# Patient Record
Sex: Female | Born: 1975 | Race: White | Hispanic: No | Marital: Married
Health system: Southern US, Community
[De-identification: ages and names within clinical notes are randomized; demographics above are authoritative.]

---

## 2001-10-22 ENCOUNTER — Ambulatory Visit (HOSPITAL_COMMUNITY): Admission: RE | Admit: 2001-10-22 | Discharge: 2001-10-22 | Payer: Self-pay | Admitting: *Deleted

## 2001-10-22 ENCOUNTER — Encounter: Payer: Self-pay | Admitting: *Deleted

## 2002-03-11 ENCOUNTER — Ambulatory Visit (HOSPITAL_COMMUNITY): Admission: AD | Admit: 2002-03-11 | Discharge: 2002-03-11 | Payer: Self-pay | Admitting: *Deleted

## 2002-03-14 ENCOUNTER — Inpatient Hospital Stay (HOSPITAL_COMMUNITY): Admission: AD | Admit: 2002-03-14 | Discharge: 2002-03-17 | Payer: Self-pay | Admitting: *Deleted

## 2013-07-15 ENCOUNTER — Ambulatory Visit: Payer: Self-pay | Admitting: Family Medicine

## 2013-12-07 ENCOUNTER — Other Ambulatory Visit (HOSPITAL_COMMUNITY): Payer: Self-pay | Admitting: Family Medicine

## 2013-12-07 ENCOUNTER — Ambulatory Visit (HOSPITAL_COMMUNITY)
Admission: RE | Admit: 2013-12-07 | Discharge: 2013-12-07 | Disposition: A | Discharge status: Home / Self Care | Payer: BC Managed Care – PPO | Source: Ambulatory Visit | Attending: Family Medicine | Admitting: Family Medicine

## 2013-12-07 DIAGNOSIS — M25559 Pain in unspecified hip: Secondary | ICD-10-CM | POA: Insufficient documentation

## 2013-12-07 DIAGNOSIS — M25552 Pain in left hip: Secondary | ICD-10-CM

## 2016-05-09 ENCOUNTER — Other Ambulatory Visit (HOSPITAL_COMMUNITY): Payer: Self-pay | Admitting: Registered Nurse

## 2016-05-09 ENCOUNTER — Ambulatory Visit (HOSPITAL_COMMUNITY)
Admission: RE | Admit: 2016-05-09 | Discharge: 2016-05-09 | Disposition: A | Discharge status: Home / Self Care | Payer: Medicaid Other | Source: Ambulatory Visit | Attending: Registered Nurse | Admitting: Registered Nurse

## 2016-05-09 DIAGNOSIS — M5410 Radiculopathy, site unspecified: Secondary | ICD-10-CM | POA: Insufficient documentation

## 2016-05-09 DIAGNOSIS — I700 Atherosclerosis of aorta: Secondary | ICD-10-CM | POA: Insufficient documentation

## 2016-05-09 DIAGNOSIS — M545 Low back pain: Secondary | ICD-10-CM | POA: Insufficient documentation

## 2016-05-09 DIAGNOSIS — M5442 Lumbago with sciatica, left side: Secondary | ICD-10-CM

## 2016-07-18 ENCOUNTER — Other Ambulatory Visit: Payer: Self-pay | Admitting: Rehabilitation

## 2016-07-18 DIAGNOSIS — M5416 Radiculopathy, lumbar region: Secondary | ICD-10-CM

## 2016-08-06 ENCOUNTER — Other Ambulatory Visit: Payer: Medicaid Other

## 2018-06-02 ENCOUNTER — Encounter (INDEPENDENT_AMBULATORY_CARE_PROVIDER_SITE_OTHER): Payer: Self-pay | Admitting: Ophthalmology

## 2018-06-17 ENCOUNTER — Encounter (INDEPENDENT_AMBULATORY_CARE_PROVIDER_SITE_OTHER): Payer: Self-pay | Admitting: Ophthalmology

## 2018-10-20 ENCOUNTER — Ambulatory Visit: Payer: Medicaid Other | Admitting: Orthopaedic Surgery

## 2018-10-20 ENCOUNTER — Other Ambulatory Visit: Payer: Self-pay

## 2018-12-23 ENCOUNTER — Other Ambulatory Visit: Payer: Self-pay | Admitting: Orthopaedic Surgery

## 2018-12-23 DIAGNOSIS — M5106 Intervertebral disc disorders with myelopathy, lumbar region: Secondary | ICD-10-CM

## 2019-01-26 ENCOUNTER — Other Ambulatory Visit: Payer: Medicaid Other

## 2019-02-01 ENCOUNTER — Other Ambulatory Visit: Payer: Self-pay

## 2019-02-01 ENCOUNTER — Ambulatory Visit
Admission: RE | Admit: 2019-02-01 | Discharge: 2019-02-01 | Disposition: A | Discharge status: Home / Self Care | Payer: BC Managed Care – PPO | Source: Ambulatory Visit | Attending: Orthopaedic Surgery | Admitting: Orthopaedic Surgery

## 2019-02-01 DIAGNOSIS — M5106 Intervertebral disc disorders with myelopathy, lumbar region: Secondary | ICD-10-CM

## 2019-02-05 ENCOUNTER — Other Ambulatory Visit: Payer: Medicaid Other

## 2019-03-02 ENCOUNTER — Ambulatory Visit: Payer: BC Managed Care – PPO | Admitting: Orthopaedic Surgery

## 2020-03-30 ENCOUNTER — Other Ambulatory Visit: Payer: Self-pay | Admitting: Orthopaedic Surgery

## 2020-03-30 DIAGNOSIS — M5416 Radiculopathy, lumbar region: Secondary | ICD-10-CM

## 2020-04-21 ENCOUNTER — Other Ambulatory Visit: Payer: BC Managed Care – PPO

## 2020-07-23 IMAGING — MR MR LUMBAR SPINE W/O CM
4 of 5 series · 25 of 48 positions shown · non-contrast
Comparison: X-ray 05/09/2016

CLINICAL DATA: Low back pain

EXAM:
MRI LUMBAR SPINE WITHOUT CONTRAST
TECHNIQUE: Multiplanar, multisequence MR imaging of the lumbar spine was
performed. No intravenous contrast was administered.

[Series 2: T2 · sagittal · 4.0mm · 0.55mm/px · 6 of 16 slices shown (1 of 2)]
[im 1/16]
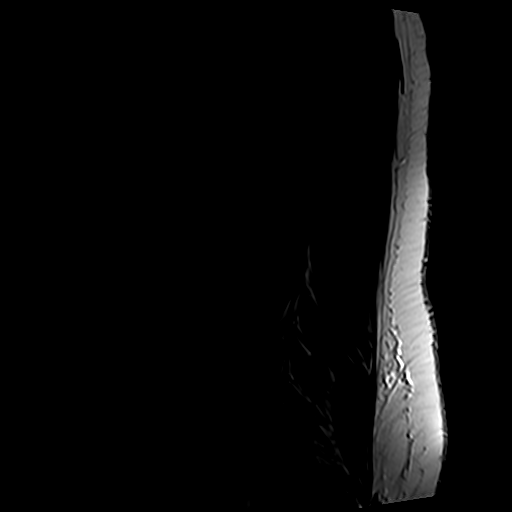
[im 4/16]
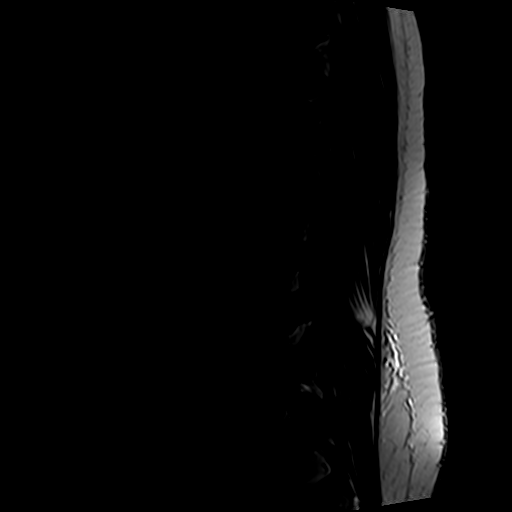
[im 7/16]
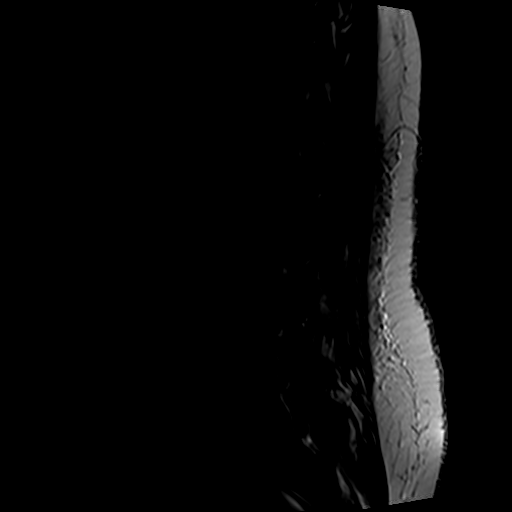
[im 10/16]
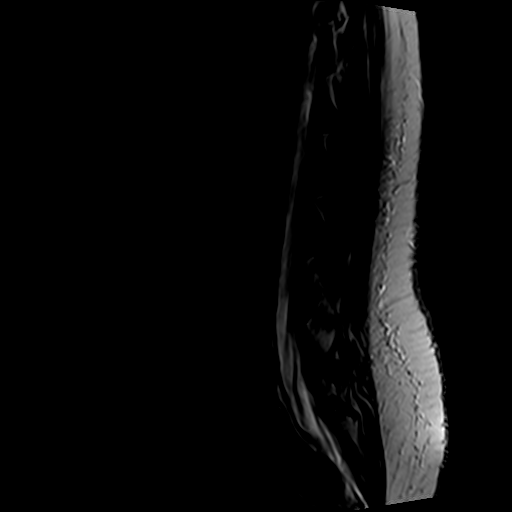
[im 13/16]
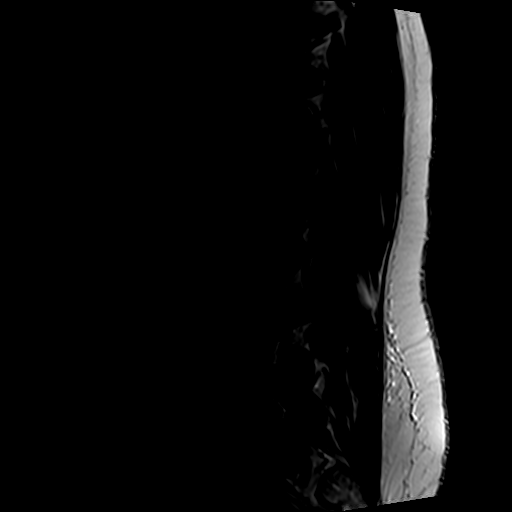
[im 16/16]
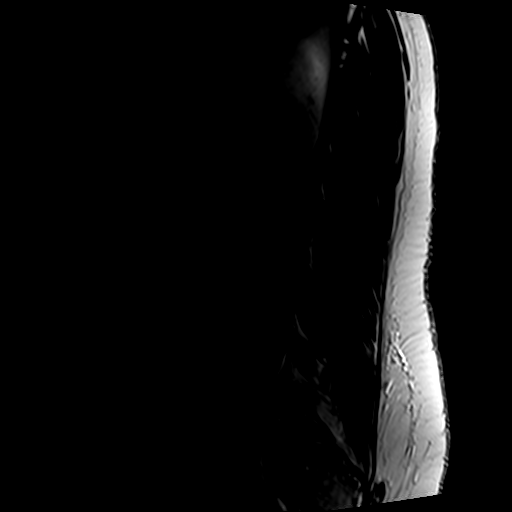

[Series 4: T1 · sagittal · 4.0mm · 0.55mm/px · 6 of 16 slices shown (1 of 2)]
[im 1/16]
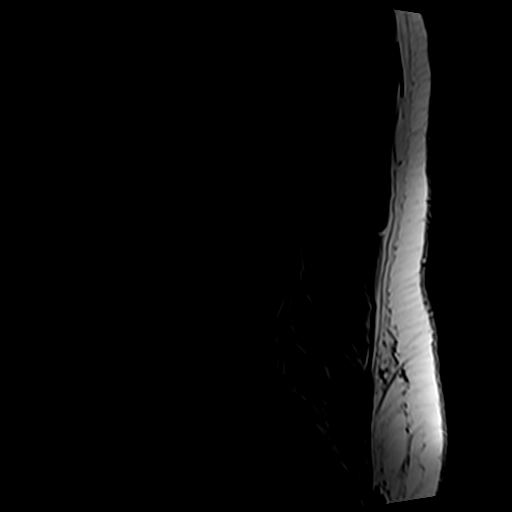
[im 4/16]
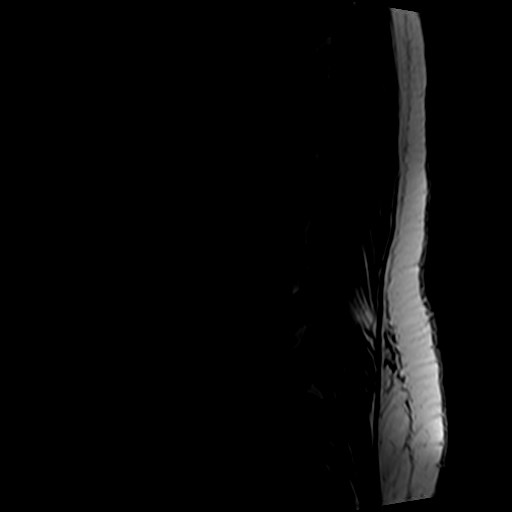
[im 7/16]
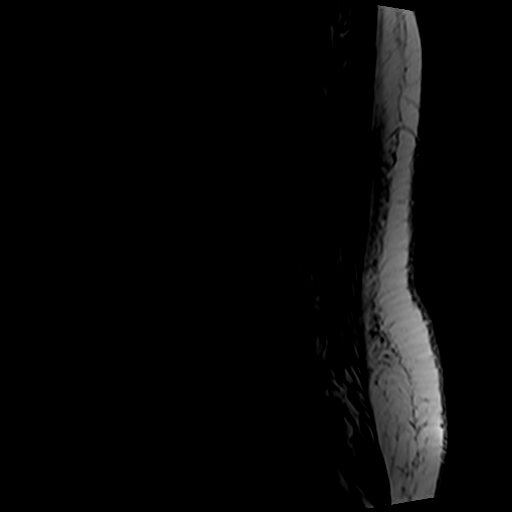
[im 10/16]
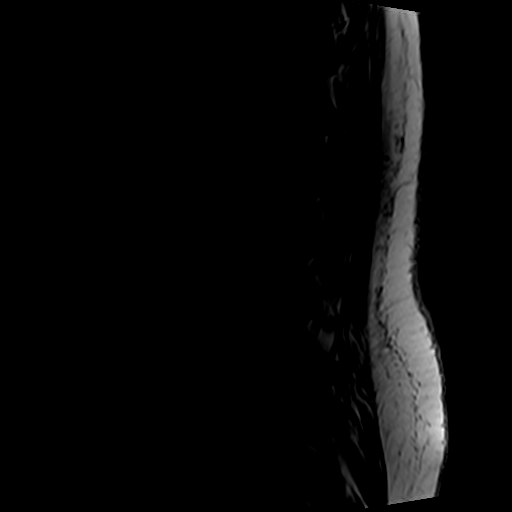
[im 13/16]
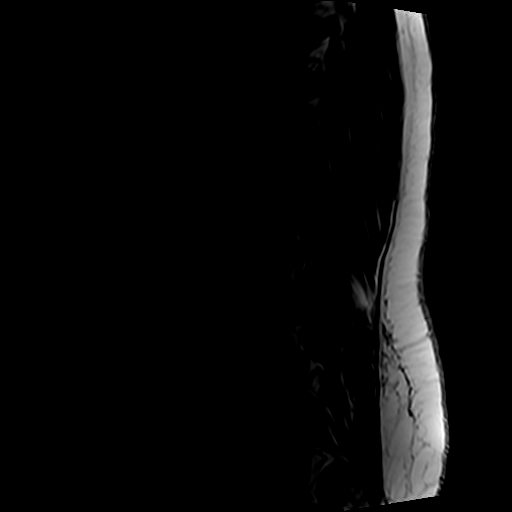
[im 16/16]
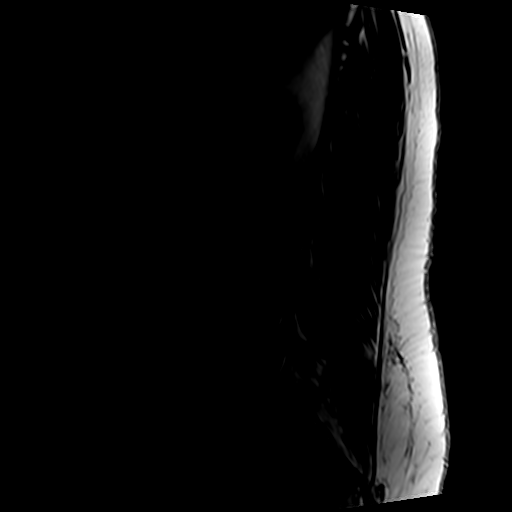

[Series 5: T2 · axial · 4.0mm · 0.70mm/px · z∈[-84,+139]mm · 9 of 39 slices shown (2 of 2)]
[im 1/39]
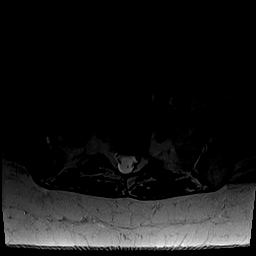
[im 6/39]
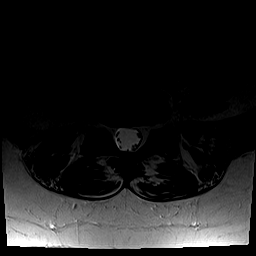
[im 11/39]
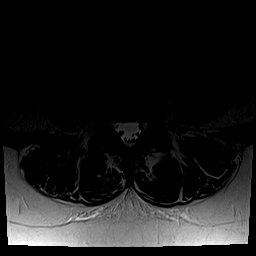
[im 17/39]
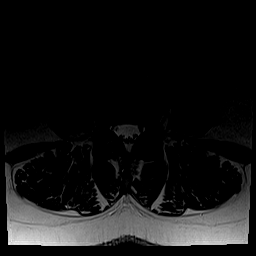
[im 20/39]
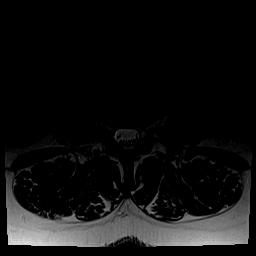
[im 22/39]
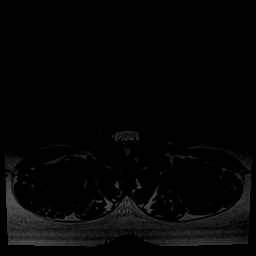
[im 28/39]
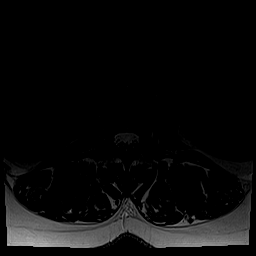
[im 33/39]
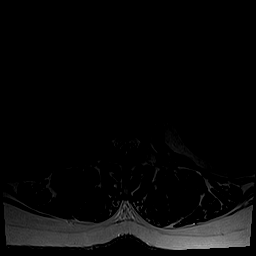
[im 39/39]
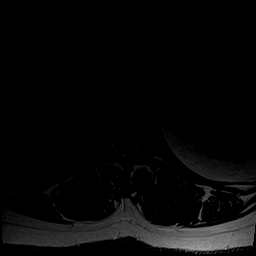

[Series 6: T1 · axial · 4.0mm · 0.35mm/px · z∈[-84,+108]mm · 4 of 39 slices shown (2 of 2)]
[im 1/39]
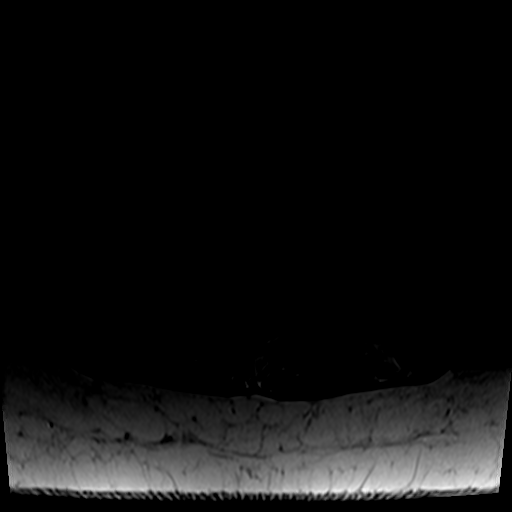
[im 6/39]
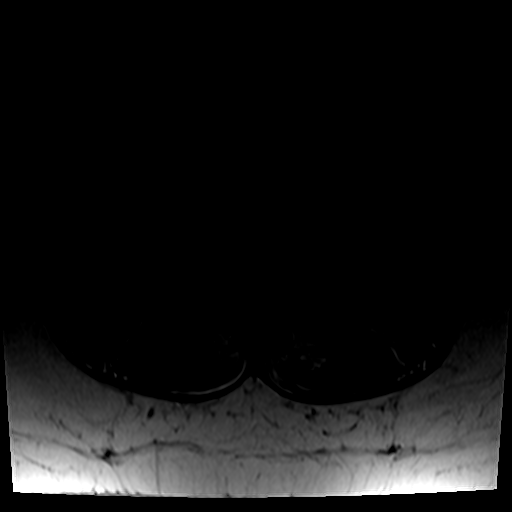
[im 20/39]
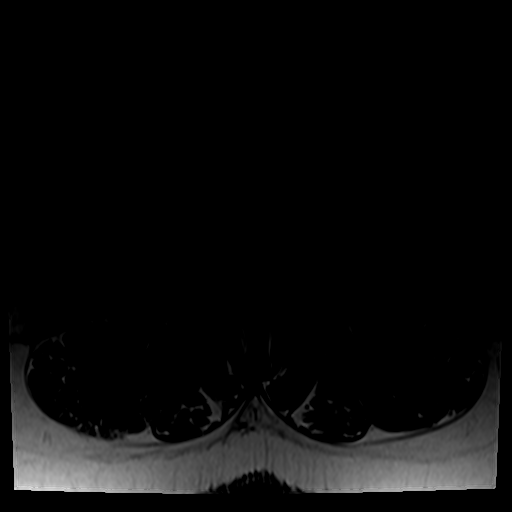
[im 33/39]
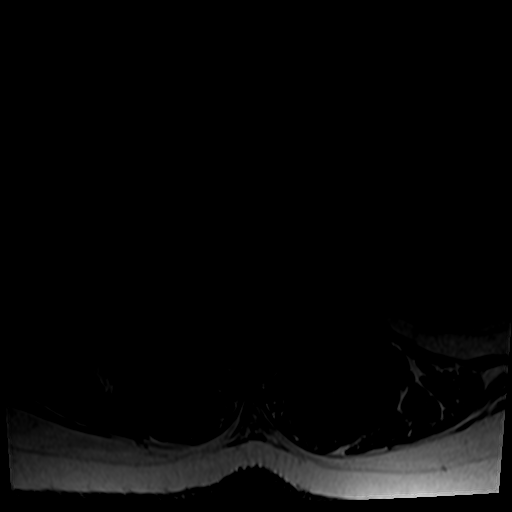

[25 of 48 positions shown; findings below may reference images not displayed]

FINDINGS: Segmentation:  Standard.

Alignment:  Physiologic.

Vertebrae:  No fracture, evidence of discitis, or bone lesion.

Conus medullaris and cauda equina: Conus extends to the L1 level.
Conus and cauda equina appear normal.

Paraspinal and other soft tissues: Negative.

Disc levels:

T12-L1: Unremarkable.

L1-L2: Unremarkable.

L2-L3: Unremarkable.

L3-L4: Unremarkable.

L4-L5: Disc desiccation with mild diffuse disc bulge, slightly
eccentric to the right. Posterior annular fissure. Minimal bilateral
facet arthrosis. No foraminal or canal stenosis.

L5-S1: Mild disc desiccation. Minimal diffuse disc bulge. Mild
bilateral facet arthrosis. No foraminal or canal stenosis.
IMPRESSION: 1. Mild degenerative disc disease of L4-5 with posterior annular
fissure. There is no foraminal or canal stenosis.
2. Mild degenerative changes at L5-S1 without foraminal or canal
stenosis.

## 2023-01-14 ENCOUNTER — Other Ambulatory Visit: Payer: Self-pay | Admitting: Orthopedic Surgery

## 2023-01-14 DIAGNOSIS — M4316 Spondylolisthesis, lumbar region: Secondary | ICD-10-CM

## 2023-01-14 DIAGNOSIS — M545 Low back pain, unspecified: Secondary | ICD-10-CM

## 2023-01-14 DIAGNOSIS — M47816 Spondylosis without myelopathy or radiculopathy, lumbar region: Secondary | ICD-10-CM

## 2023-01-14 DIAGNOSIS — Z6841 Body Mass Index (BMI) 40.0 and over, adult: Secondary | ICD-10-CM

## 2023-02-03 ENCOUNTER — Inpatient Hospital Stay: Admission: RE | Admit: 2023-02-03 | Payer: BC Managed Care – PPO | Source: Ambulatory Visit

## 2023-03-03 ENCOUNTER — Ambulatory Visit
Admission: RE | Admit: 2023-03-03 | Discharge: 2023-03-03 | Disposition: A | Discharge status: Home / Self Care | Payer: BC Managed Care – PPO | Source: Ambulatory Visit | Attending: Orthopedic Surgery | Admitting: Orthopedic Surgery

## 2023-03-03 DIAGNOSIS — M47816 Spondylosis without myelopathy or radiculopathy, lumbar region: Secondary | ICD-10-CM

## 2023-03-03 DIAGNOSIS — M4316 Spondylolisthesis, lumbar region: Secondary | ICD-10-CM

## 2023-03-03 DIAGNOSIS — M545 Low back pain, unspecified: Secondary | ICD-10-CM

## 2023-03-03 DIAGNOSIS — Z6841 Body Mass Index (BMI) 40.0 and over, adult: Secondary | ICD-10-CM
